# Patient Record
Sex: Male | Born: 1990 | Race: White | Hispanic: No | Marital: Single | Smoking: Never smoker
Health system: Southern US, Community
[De-identification: ages and names within clinical notes are randomized; demographics above are authoritative.]

---

## 2018-01-28 ENCOUNTER — Ambulatory Visit
Admission: RE | Admit: 2018-01-28 | Discharge: 2018-01-28 | Disposition: A | Discharge status: Home / Self Care | Payer: No Typology Code available for payment source | Source: Ambulatory Visit | Attending: Nurse Practitioner | Admitting: Nurse Practitioner

## 2018-01-28 ENCOUNTER — Other Ambulatory Visit: Payer: Self-pay | Admitting: Nurse Practitioner

## 2018-01-28 DIAGNOSIS — Z021 Encounter for pre-employment examination: Secondary | ICD-10-CM

## 2021-05-19 ENCOUNTER — Emergency Department (HOSPITAL_BASED_OUTPATIENT_CLINIC_OR_DEPARTMENT_OTHER)
Admission: EM | Admit: 2021-05-19 | Discharge: 2021-05-19 | Disposition: A | Discharge status: Home / Self Care | Payer: No Typology Code available for payment source | Attending: Emergency Medicine | Admitting: Emergency Medicine

## 2021-05-19 ENCOUNTER — Other Ambulatory Visit: Payer: Self-pay

## 2021-05-19 ENCOUNTER — Encounter (HOSPITAL_BASED_OUTPATIENT_CLINIC_OR_DEPARTMENT_OTHER): Payer: Self-pay

## 2021-05-19 ENCOUNTER — Emergency Department (HOSPITAL_BASED_OUTPATIENT_CLINIC_OR_DEPARTMENT_OTHER): Payer: No Typology Code available for payment source | Admitting: Radiology

## 2021-05-19 DIAGNOSIS — S8991XA Unspecified injury of right lower leg, initial encounter: Secondary | ICD-10-CM | POA: Diagnosis present

## 2021-05-19 DIAGNOSIS — W1830XA Fall on same level, unspecified, initial encounter: Secondary | ICD-10-CM | POA: Insufficient documentation

## 2021-05-19 DIAGNOSIS — M25561 Pain in right knee: Secondary | ICD-10-CM | POA: Diagnosis not present

## 2021-05-19 DIAGNOSIS — S8001XA Contusion of right knee, initial encounter: Secondary | ICD-10-CM | POA: Diagnosis not present

## 2021-05-19 MED ORDER — NAPROXEN 250 MG PO TABS
500.0000 mg | ORAL_TABLET | Freq: Once | ORAL | Status: AC
Start: 2021-05-19 — End: 2021-05-19
  Administered 2021-05-19: 500 mg via ORAL
  Filled 2021-05-19: qty 2

## 2021-05-19 MED ORDER — NAPROXEN 375 MG PO TABS: ORAL_TABLET | ORAL | 0 refills | Status: AC
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
## 2021-05-19 NOTE — ED Notes (Signed)
This RN presented the AVS utilizing Teachback Method. Patient verbalizes understanding of Discharge Instructions. Opportunity for Questioning and Answers were provided. Patient Discharged from ED in wheelchair to Home with Co-Workers
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
## 2022-12-18 IMAGING — DX DG KNEE AP/LAT W/ SUNRISE*R*
3 series · 3 of 3 positions shown · non-contrast
Comparison: None.

CLINICAL DATA: Right knee pain

EXAM:
RIGHT KNEE 3 VIEWS

[knee ap]
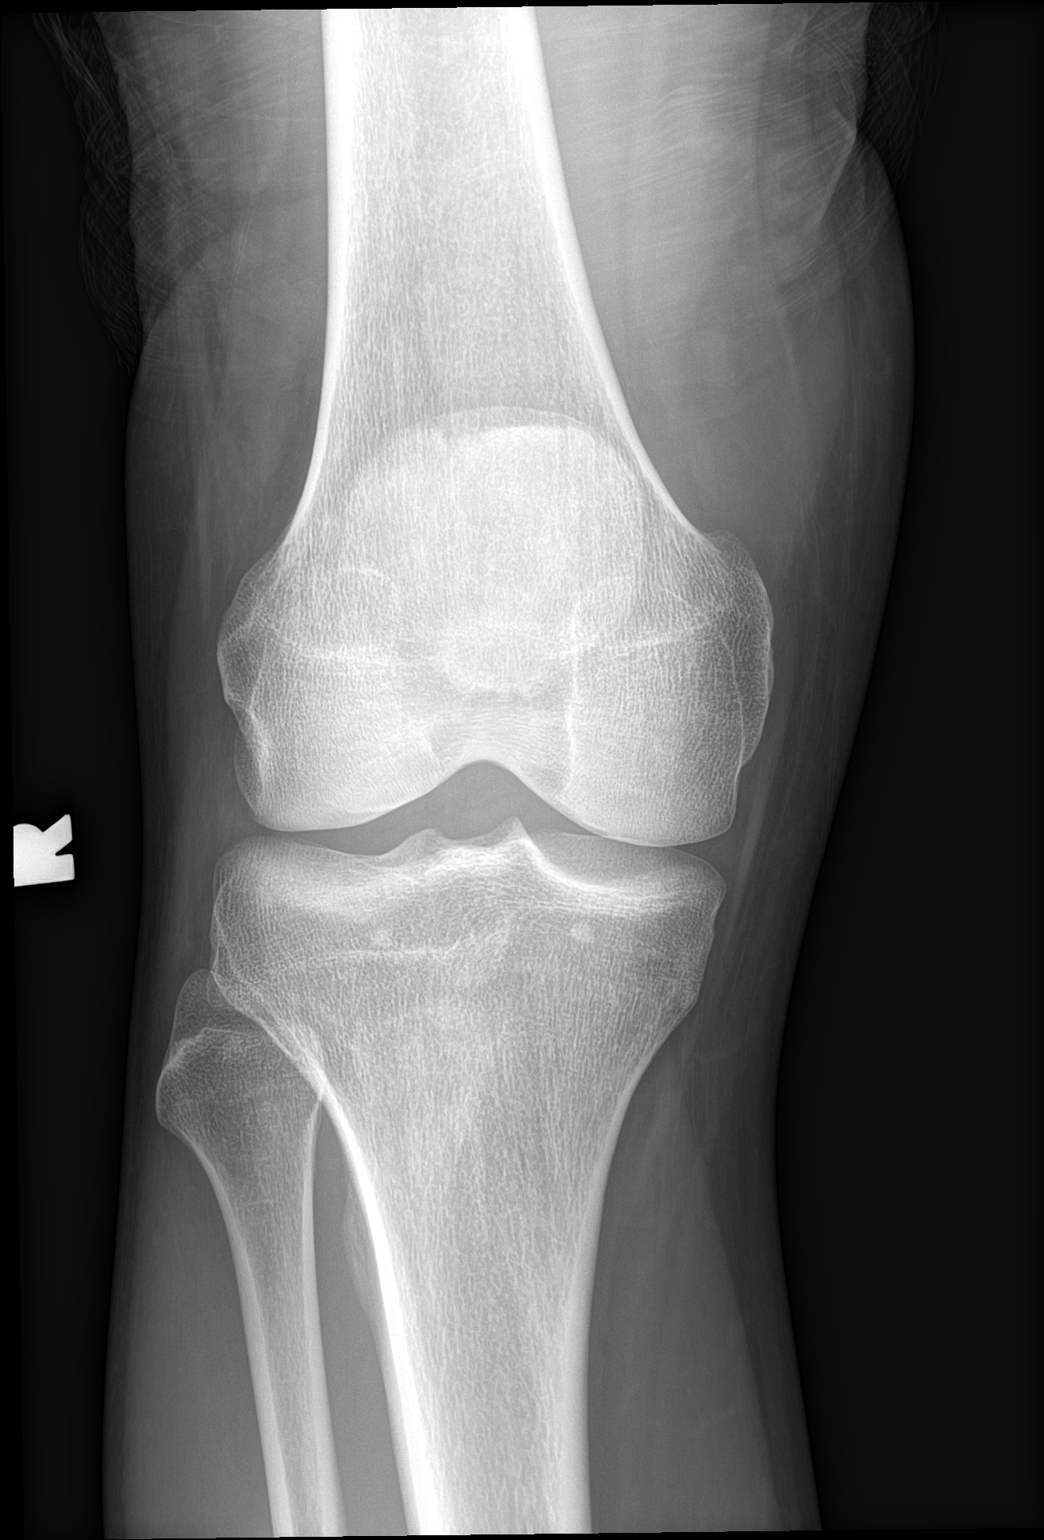

[knee lat]
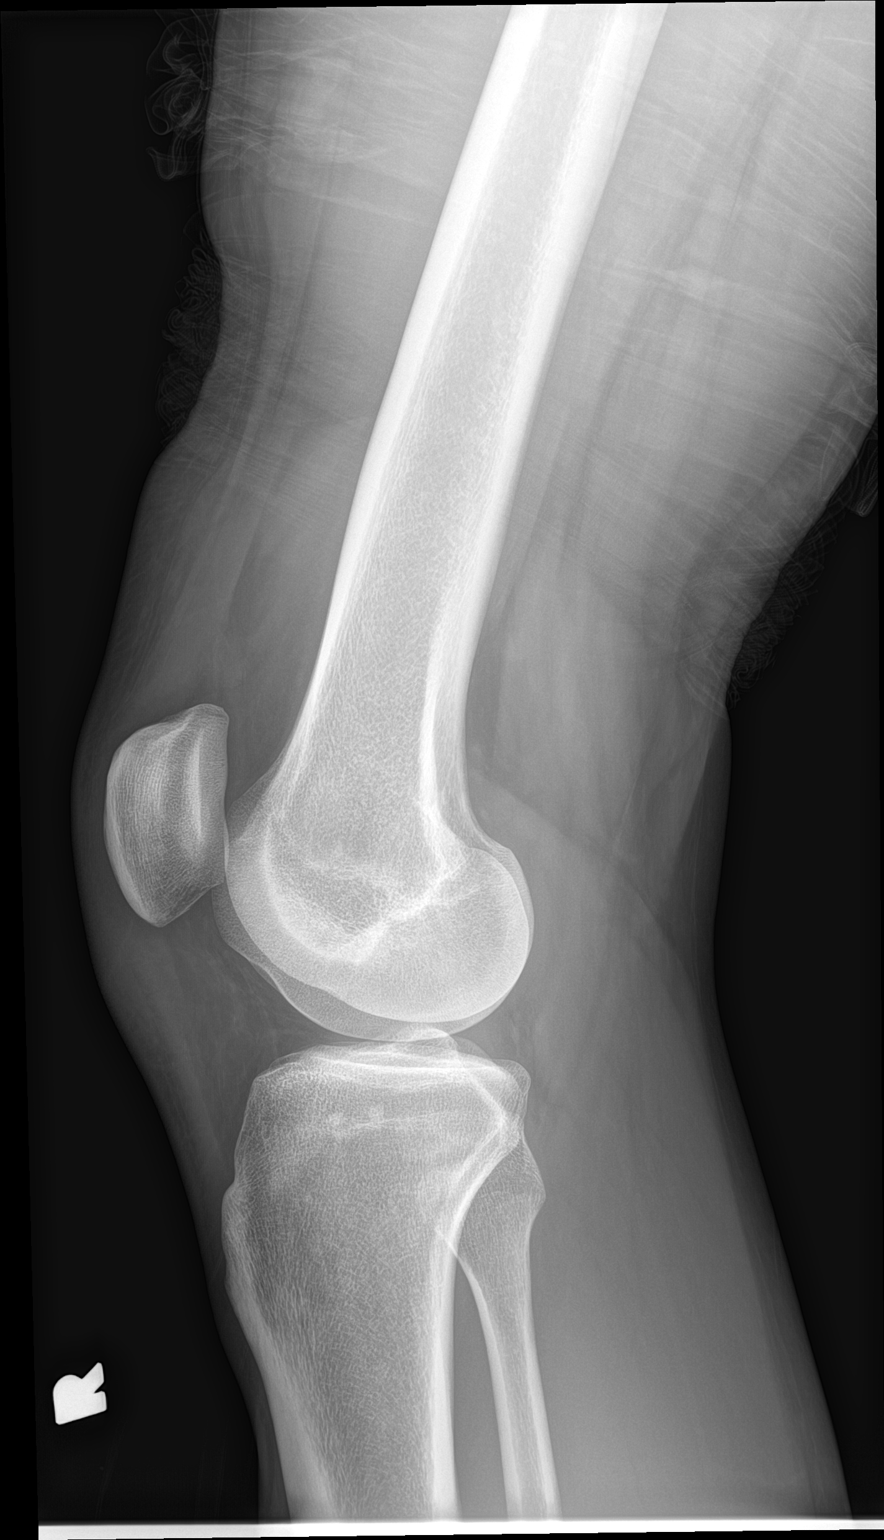

[patella skyline]
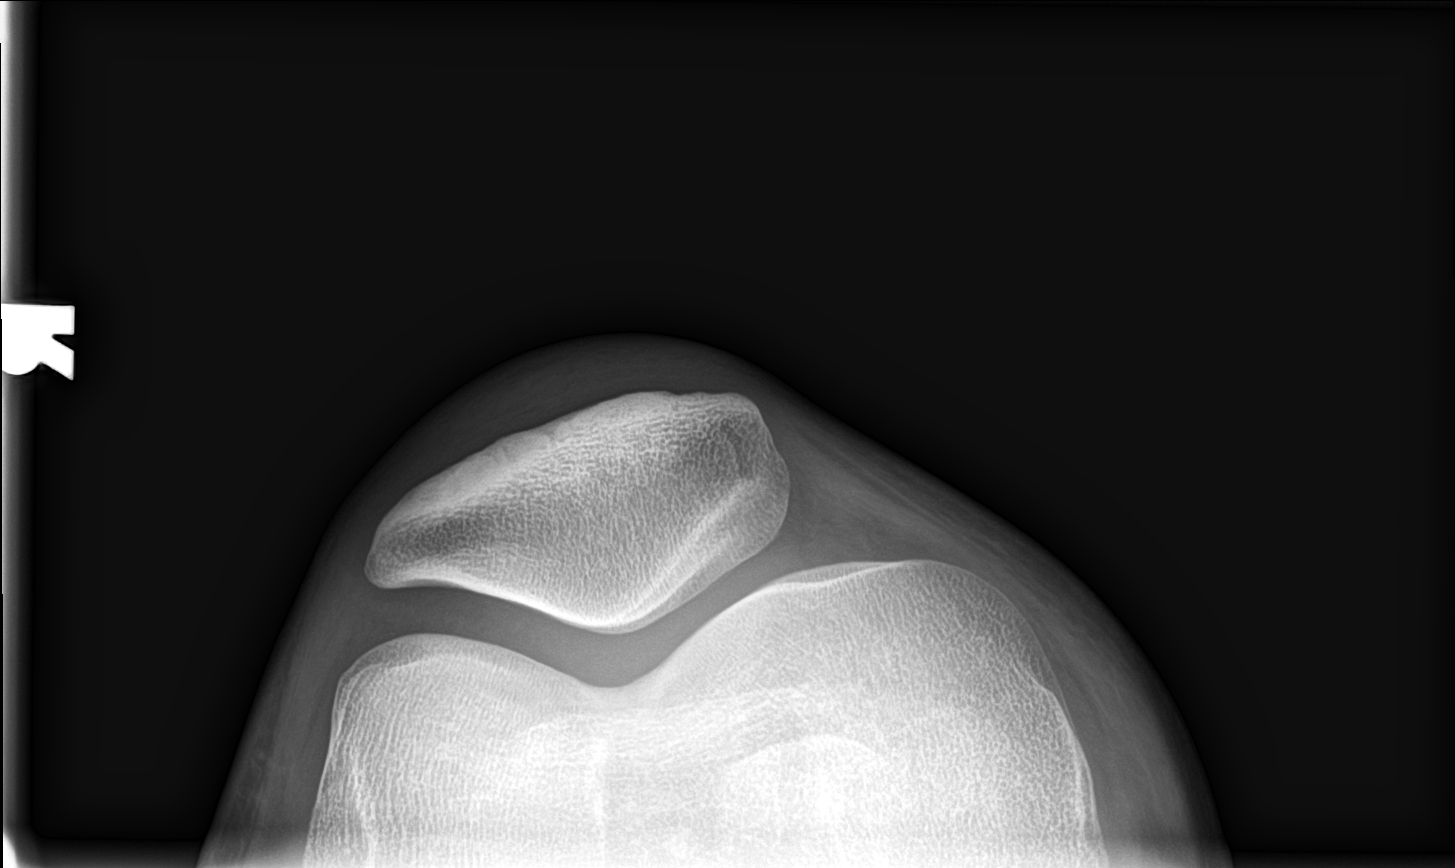

[3 of 3 positions shown; findings below may reference images not displayed]

FINDINGS: No evidence of fracture, dislocation, or joint effusion. No evidence
of arthropathy or other focal bone abnormality. Soft tissues are
unremarkable.
IMPRESSION: Negative.
